# Patient Record
Sex: Male | Born: 1965 | Race: Black or African American | Hispanic: No | Marital: Single | Smoking: Current every day smoker
Health system: Southern US, Community
[De-identification: ages and names within clinical notes are randomized; demographics above are authoritative.]

---

## 2015-08-21 ENCOUNTER — Encounter: Payer: Self-pay | Admitting: *Deleted

## 2015-08-21 ENCOUNTER — Emergency Department
Admission: EM | Admit: 2015-08-21 | Discharge: 2015-08-21 | Disposition: A | Discharge status: Home / Self Care | Payer: Self-pay | Attending: Emergency Medicine | Admitting: Emergency Medicine

## 2015-08-21 DIAGNOSIS — K047 Periapical abscess without sinus: Secondary | ICD-10-CM | POA: Insufficient documentation

## 2015-08-21 DIAGNOSIS — K032 Erosion of teeth: Secondary | ICD-10-CM | POA: Insufficient documentation

## 2015-08-21 DIAGNOSIS — K029 Dental caries, unspecified: Secondary | ICD-10-CM | POA: Insufficient documentation

## 2015-08-21 MED ORDER — MAGIC MOUTHWASH W/LIDOCAINE: 5.0000 mL | Freq: Four times a day (QID) | ORAL | Status: DC

## 2015-08-21 MED ORDER — AMOXICILLIN 875 MG PO TABS: 875.0000 mg | ORAL_TABLET | Freq: Two times a day (BID) | ORAL | Status: DC

## 2015-08-21 NOTE — Discharge Instructions (Signed)
Dental Abscess °A dental abscess is a collection of pus in or around a tooth. °CAUSES °This condition is caused by a bacterial infection around the root of the tooth that involves the inner part of the tooth (pulp). It may result from: °· Severe tooth decay. °· Trauma to the tooth that allows bacteria to enter into the pulp, such as a broken or chipped tooth. °· Severe gum disease around a tooth. °SYMPTOMS °Symptoms of this condition include: °· Severe pain in and around the infected tooth. °· Swelling and redness around the infected tooth, in the mouth, or in the face. °· Tenderness. °· Pus drainage. °· Bad breath. °· Bitter taste in the mouth. °· Difficulty swallowing. °· Difficulty opening the mouth. °· Nausea. °· Vomiting. °· Chills. °· Swollen neck glands. °· Fever. °DIAGNOSIS °This condition is diagnosed with examination of the infected tooth. During the exam, your dentist may tap on the infected tooth. Your dentist will also ask about your medical and dental history and may order X-rays. °TREATMENT °This condition is treated by eliminating the infection. This may be done with: °· Antibiotic medicine. °· A root canal. This may be performed to save the tooth. °· Pulling (extracting) the tooth. This may also involve draining the abscess. This is done if the tooth cannot be saved. °HOME CARE INSTRUCTIONS °· Take medicines only as directed by your dentist. °· If you were prescribed antibiotic medicine, finish all of it even if you start to feel better. °· Rinse your mouth (gargle) often with salt water to relieve pain or swelling. °· Do not drive or operate heavy machinery while taking pain medicine. °· Do not apply heat to the outside of your mouth. °· Keep all follow-up visits as directed by your dentist. This is important. °SEEK MEDICAL CARE IF: °· Your pain is worse and is not helped by medicine. °SEEK IMMEDIATE MEDICAL CARE IF: °· You have a fever or chills. °· Your symptoms suddenly get worse. °· You have a  very bad headache. °· You have problems breathing or swallowing. °· You have trouble opening your mouth. °· You have swelling in your neck or around your eye. °  °This information is not intended to replace advice given to you by your health care provider. Make sure you discuss any questions you have with your health care provider. °  °Document Released: 03/30/2005 Document Revised: 08/14/2014 Document Reviewed: 03/27/2014 °Elsevier Interactive Patient Education ©2016 [employer] ° °Dental Care and Dentist Visits °Dental care supports good overall health. Regular dental visits can also help you avoid dental pain, bleeding, infection, and other more serious health problems in the future. It is important to keep the mouth healthy because diseases in the teeth, gums, and other oral tissues can spread to other areas of the body. Some problems, such as diabetes, heart disease, and pre-term labor have been associated with poor oral health.  °See your dentist every 6 months. If you experience emergency problems such as a toothache or broken tooth, go to the dentist right away. If you see your dentist regularly, you may catch problems early. It is easier to be treated for problems in the early stages.  °WHAT TO EXPECT AT A DENTIST VISIT  °Your dentist will look for many common oral health problems and recommend proper treatment. At your regular dental visit, you can expect: °· Gentle cleaning of the teeth and gums. This includes scraping and polishing. This helps to remove the sticky substance around the teeth and gums (  plaque). Plaque forms in the mouth shortly after eating. Over time, plaque hardens on the teeth as tartar. If tartar is not removed regularly, it can cause problems. Cleaning also helps remove stains. °· Periodic X-rays. These pictures of the teeth and supporting bone will help your dentist assess the health of your teeth. °· Periodic fluoride treatments. Fluoride is a natural mineral shown to help  strengthen teeth. Fluoride treatment involves applying a fluoride gel or varnish to the teeth. It is most commonly done in children. °· Examination of the mouth, tongue, jaws, teeth, and gums to look for any oral health problems, such as: °¨ Cavities (dental caries). This is decay on the tooth caused by plaque, sugar, and acid in the mouth. It is best to catch a cavity when it is small. °¨ Inflammation of the gums caused by plaque buildup (gingivitis). °¨ Problems with the mouth or malformed or misaligned teeth. °¨ Oral cancer or other diseases of the soft tissues or jaws.  °KEEP YOUR TEETH AND GUMS HEALTHY °For healthy teeth and gums, follow these general guidelines as well as your dentist's specific advice: °· Have your teeth professionally cleaned at the dentist every 6 months. °· Brush twice daily with a fluoride toothpaste. °· Floss your teeth daily.  °· Ask your dentist if you need fluoride supplements, treatments, or fluoride toothpaste. °· Eat a healthy diet. Reduce foods and drinks with added sugar. °· Avoid smoking. °TREATMENT FOR ORAL HEALTH PROBLEMS °If you have oral health problems, treatment varies depending on the conditions present in your teeth and gums. °· Your caregiver will most likely recommend good oral hygiene at each visit. °· For cavities, gingivitis, or other oral health disease, your caregiver will perform a procedure to treat the problem. This is typically done at a separate appointment. Sometimes your caregiver will refer you to another dental specialist for specific tooth problems or for surgery. °SEEK IMMEDIATE DENTAL CARE IF: °· You have pain, bleeding, or soreness in the gum, tooth, jaw, or mouth area. °· A permanent tooth becomes loose or separated from the gum socket. °· You experience a blow or injury to the mouth or jaw area. °  °This information is not intended to replace advice given to you by your health care provider. Make sure you discuss any questions you have with your  health care provider. °  °Document Released: 12/10/2010 Document Revised: 06/22/2011 Document Reviewed: 12/10/2010 °Elsevier Interactive Patient Education ©2016 Elsevier Inc. ° °

## 2015-08-21 NOTE — ED Notes (Signed)
States left sided dental pain and swelling for 4 days

## 2015-08-21 NOTE — ED Provider Notes (Signed)
[facility] Emergency Department Provider Note  ____________________________________________  Time seen: Approximately 3:13 PM  I have reviewed the triage vital signs and the nursing notes.   HISTORY  Chief Complaint Dental Pain    HPI Ian Jacobs is a 50 y.o. male complaining of left sided dental and facial pain x 4 days. Patient describes an extensive dental history of dental caries beginning 10 yrs ago. He attributes this most recent episode to a pervious GI illness approximately 1 week ago. He says he had a sandwich from [place] and had severe nausea, vomiting and diarrhea for the next 3 days. He noticed the dental pain he has today immediately after his vomiting subsided. He admits to limited ROM due to pain, anorexia, and swelling of the left jaw and neck. He denies HA, changes in vision, changes in hearing or fever.    History reviewed. No pertinent past medical history.  There are no active problems to display for this patient.   No past surgical history on file.  Current Outpatient Rx  Name  Route  Sig  Dispense  Refill  . amoxicillin (AMOXIL) 875 MG tablet   Oral   Take 1 tablet (875 mg total) by mouth 2 (two) times daily.   14 tablet   0   . magic mouthwash w/lidocaine SOLN   Oral   Take 5 mLs by mouth 4 (four) times daily.   240 mL   0     Dispense in a 1/1/1/1 ratio. Use lidocaine, diphen ...     Allergies Review of patient's allergies indicates no known allergies.  History reviewed. No pertinent family history.  Social History Social History  Substance Use Topics  . Smoking status: None  . Smokeless tobacco: None  . Alcohol Use: None     Review of Systems  Constitutional: No fever/chills Eyes: No visual changes. No discharge ENT: No upper respiratory complaints. Cardiovascular: no chest pain. Respiratory: no cough. No SOB. Gastrointestinal: No abdominal pain. nausea, vomiting and diarrhea 1 week ago. No  constipation. Musculoskeletal: Negative for musculoskeletal pain. Skin: Negative for rash, abrasions, lacerations, ecchymosis. Neurological: Negative for headaches, focal weakness or numbness. 10-point ROS otherwise negative.  ____________________________________________   PHYSICAL EXAM:  VITAL SIGNS: ED Triage Vitals  Enc Vitals Group     BP 08/21/15 1442 132/77 mmHg     Pulse Rate 08/21/15 1442 82     Resp 08/21/15 1442 18     Temp 08/21/15 1442 98.9 F (37.2 C)     Temp Source 08/21/15 1442 Oral     SpO2 08/21/15 1442 99 %     Weight 08/21/15 1442 179 lb (81.194 kg)     Height 08/21/15 1442  (1.803 m)     Head Cir --      Peak Flow --      Pain Score 08/21/15 1443 10     Pain Loc --      Pain Edu? --      Excl. in GC? --      Constitutional: Alert and oriented. Well appearing and in no acute distress. Eyes: Conjunctivae are normal. PERRL. EOMI. Head: Atraumatic. ENT:      Mouth/Throat: Mucous membranes are moist. Multiple dental caries evident bilaterally. Missing dentition throughout. Swelling present in left lower gumline. Halotosis evident Neck: No stridor. Hematological/Lymphatic/Immunilogical:No cervical lymphadenopathy, left-sided sublingual lymphadenopathy present.  Respiratory: Normal respiratory effort without tachypnea or retractions. Musculoskeletal: Full range of motion to all extremities. No gross deformities appreciated.  Neurologic:  Normal speech and language. No gross focal neurologic deficits are appreciated.  Skin:  Skin is warm, dry and intact. No rash noted. Psychiatric: Mood and affect are normal. Speech and behavior are normal. Patient exhibits appropriate insight and judgement.   ____________________________________________   LABS (all labs ordered are listed, but only abnormal results are displayed)  Labs Reviewed - No data to  display ____________________________________________  EKG   ____________________________________________  RADIOLOGY   No results found.  ____________________________________________    PROCEDURES  Procedure(s) performed:       Medications - No data to display   ____________________________________________   INITIAL IMPRESSION / ASSESSMENT AND PLAN / ED COURSE  Pertinent labs & imaging results that were available during my care of the patient were reviewed by me and considered in my medical decision making (see chart for details).  Patient's diagnosis is consistent with dental infection, dental caries, and dental erosions. Patient will be discharged home with prescriptions for antibiotics and magic mouthwash. Patient is to follow up with dentist. Patient is given ED precautions to return to the ED for any worsening or new symptoms.     ____________________________________________  FINAL CLINICAL IMPRESSION(S) / ED DIAGNOSES  Final diagnoses:  Dental infection  Dental caries  Dental erosion extending into pulp      NEW MEDICATIONS STARTED DURING THIS VISIT:  Discharge Medication List as of 08/21/2015  4:02 PM    START taking these medications   Details  amoxicillin (AMOXIL) 875 MG tablet Take 1 tablet (875 mg total) by mouth 2 (two) times daily., Starting 08/21/2015, Until Discontinued, Print    magic mouthwash w/lidocaine SOLN Take 5 mLs by mouth 4 (four) times daily., Starting 08/21/2015, Until Discontinued, Print            This chart was dictated using voice recognition software/Dragon. Despite best efforts to proofread, errors can occur which can change the meaning. Any change was purely unintentional.    Racheal PatchesJonathan D Jerelle Virden, PA-C 08/21/15 1637  Myrna Blazeravid Matthew Schaevitz, MD 08/22/15 [phone]

## 2017-10-29 ENCOUNTER — Ambulatory Visit
Admission: RE | Admit: 2017-10-29 | Discharge: 2017-10-29 | Disposition: A | Discharge status: Home / Self Care | Payer: Disability Insurance | Source: Ambulatory Visit | Attending: Obstetrics and Gynecology | Admitting: Obstetrics and Gynecology

## 2017-10-29 ENCOUNTER — Other Ambulatory Visit: Payer: Self-pay | Admitting: Obstetrics and Gynecology

## 2017-10-29 DIAGNOSIS — M545 Low back pain: Secondary | ICD-10-CM | POA: Diagnosis present

## 2017-10-29 DIAGNOSIS — M539 Dorsopathy, unspecified: Secondary | ICD-10-CM

## 2017-10-29 DIAGNOSIS — M4606 Spinal enthesopathy, lumbar region: Secondary | ICD-10-CM | POA: Insufficient documentation

## 2018-02-19 ENCOUNTER — Emergency Department
Admission: EM | Admit: 2018-02-19 | Discharge: 2018-02-19 | Disposition: A | Discharge status: Home / Self Care | Payer: Self-pay | Attending: Emergency Medicine | Admitting: Emergency Medicine

## 2018-02-19 ENCOUNTER — Other Ambulatory Visit: Payer: Self-pay

## 2018-02-19 ENCOUNTER — Encounter: Payer: Self-pay | Admitting: Emergency Medicine

## 2018-02-19 DIAGNOSIS — R200 Anesthesia of skin: Secondary | ICD-10-CM | POA: Insufficient documentation

## 2018-02-19 DIAGNOSIS — F17200 Nicotine dependence, unspecified, uncomplicated: Secondary | ICD-10-CM | POA: Insufficient documentation

## 2018-02-19 DIAGNOSIS — T754XXA Electrocution, initial encounter: Secondary | ICD-10-CM | POA: Insufficient documentation

## 2018-02-19 MED ORDER — GABAPENTIN 300 MG PO CAPS: 300.0000 mg | ORAL_CAPSULE | Freq: Three times a day (TID) | ORAL | 1 refills | Status: DC

## 2018-02-19 MED ORDER — MELOXICAM 15 MG PO TABS: 15.0000 mg | ORAL_TABLET | Freq: Every day | ORAL | 1 refills | Status: DC
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
## 2018-02-19 NOTE — Discharge Instructions (Addendum)
Follow-up with orthopedics for evaluation of nerve damage in the foot.  Take the medications as prescribed.  Return to the emergency department if worsening.

## 2019-06-26 ENCOUNTER — Emergency Department
Admission: EM | Admit: 2019-06-26 | Discharge: 2019-06-26 | Disposition: A | Discharge status: Home / Self Care | Payer: Medicaid Other | Attending: Emergency Medicine | Admitting: Emergency Medicine

## 2019-06-26 ENCOUNTER — Other Ambulatory Visit: Payer: Self-pay

## 2019-06-26 ENCOUNTER — Encounter: Payer: Self-pay | Admitting: Emergency Medicine

## 2019-06-26 DIAGNOSIS — Z5321 Procedure and treatment not carried out due to patient leaving prior to being seen by health care provider: Secondary | ICD-10-CM | POA: Insufficient documentation

## 2019-06-26 DIAGNOSIS — N50819 Testicular pain, unspecified: Secondary | ICD-10-CM | POA: Insufficient documentation

## 2019-06-26 DIAGNOSIS — R52 Pain, unspecified: Secondary | ICD-10-CM

## 2019-06-26 DIAGNOSIS — R609 Edema, unspecified: Secondary | ICD-10-CM

## 2019-06-26 NOTE — ED Triage Notes (Signed)
FIRST NURSE NOTE- pt was working out and fell.  C/o groin pain and swelling as well as to testicle/penis.  Ambulatory.

## 2019-06-26 NOTE — ED Triage Notes (Signed)
Pt arrival via POV from home. Pt states he was working out and using an exercise ball and that he hit hurt his pelvis/scrotum and that they've been swollen since then. Pt states 'my balls are swollen and my penis is swollen on one side'.   Pt A&Ox4 and denies fevers, shob, cough.

## 2019-06-26 NOTE — ED Notes (Signed)
Pt noted leaving in a car by [employer]

## 2019-06-27 ENCOUNTER — Telehealth: Payer: Self-pay | Admitting: Emergency Medicine

## 2019-06-27 NOTE — Telephone Encounter (Signed)
Called patient due to lwot to inquire about condition and follow up plans. Left message.   

## 2019-07-06 DIAGNOSIS — M545 Low back pain, unspecified: Secondary | ICD-10-CM | POA: Insufficient documentation

## 2019-07-06 DIAGNOSIS — M25461 Effusion, right knee: Secondary | ICD-10-CM | POA: Insufficient documentation

## 2020-02-11 IMAGING — CR DG LUMBAR SPINE 2-3V
1 series · 3 of 3 positions shown · non-contrast
Comparison: None.

CLINICAL DATA: Pain lower back into left hip. Repetitive injury
from competitive weight lifting 20 yrs ago.

EXAM:
LUMBAR SPINE - 2-3 VIEW

[Series 1: t lumbar spine ap · 0.14mm/px · 3 of 3 slices shown]
[im 1/3]
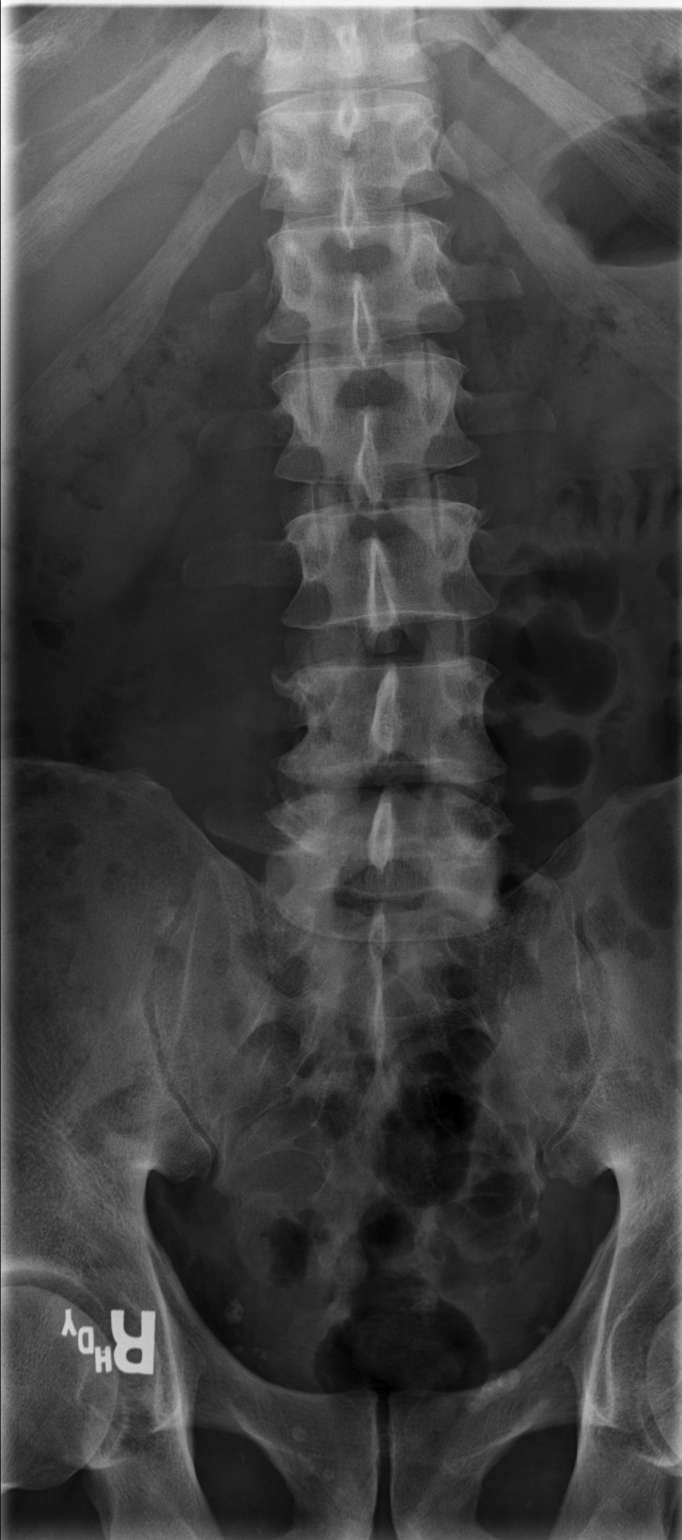
[im 2/3]
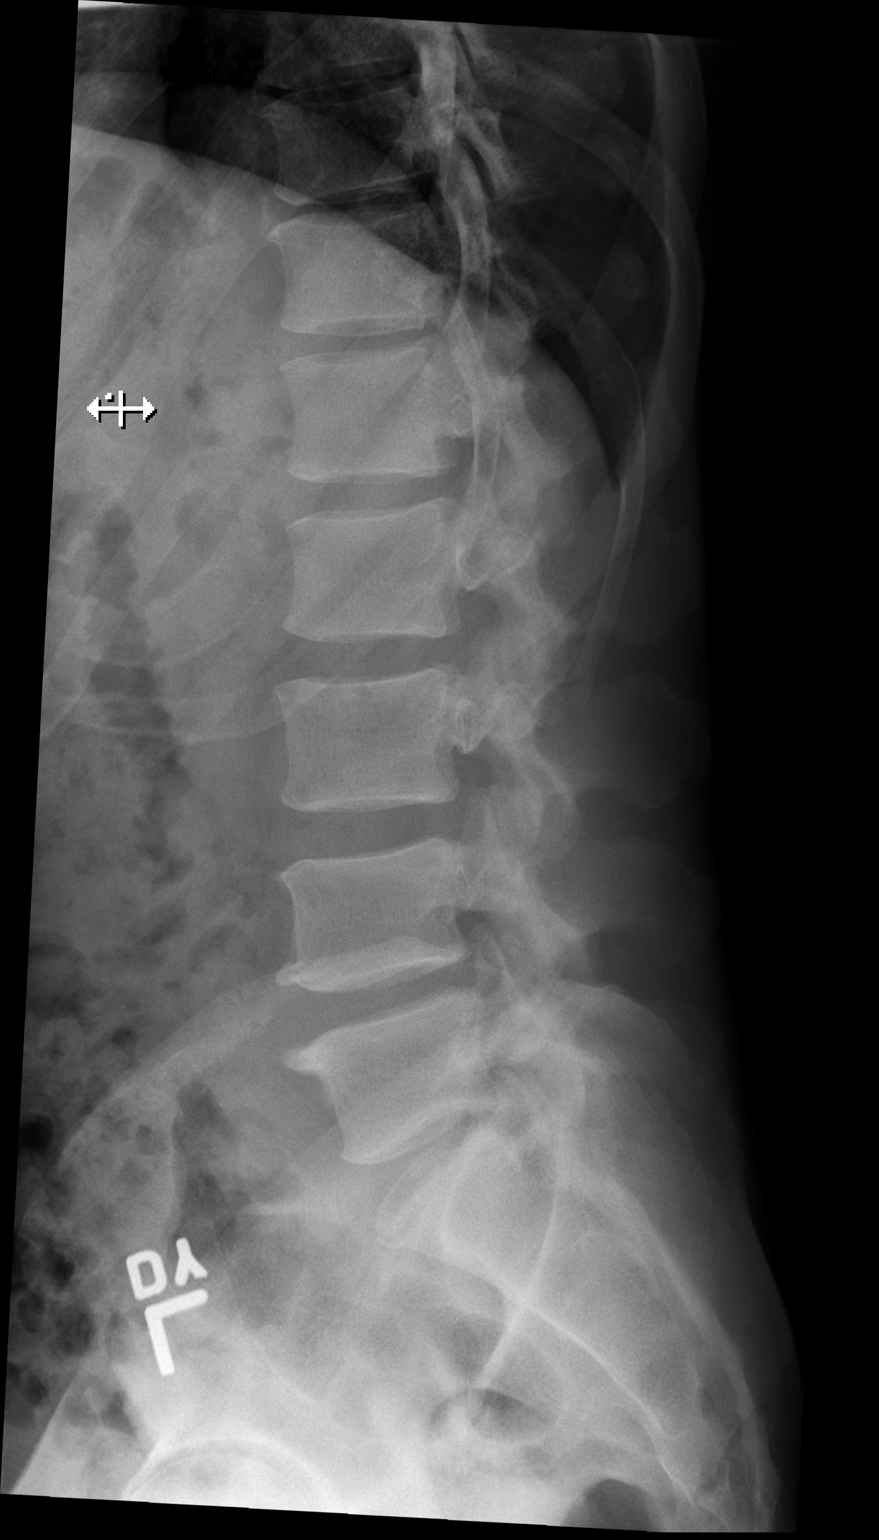
[im 3/3]
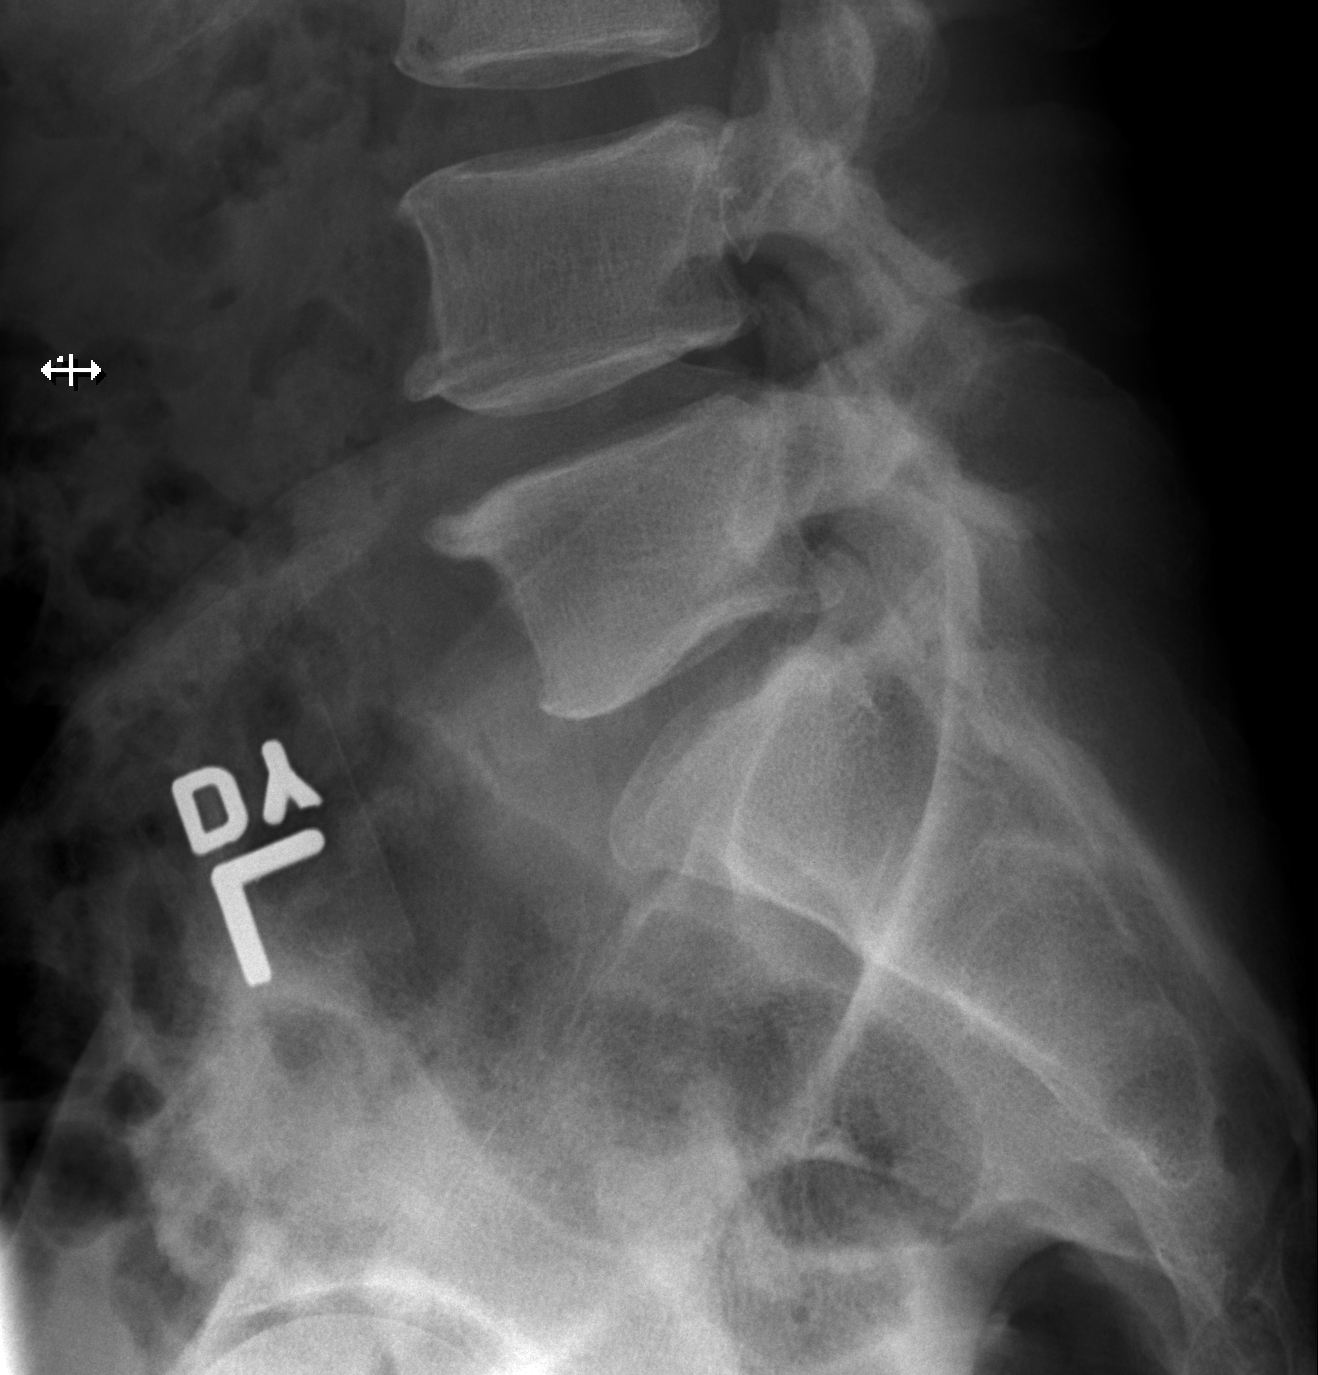

[3 of 3 positions shown; findings below may reference images not displayed]

FINDINGS: There is 5 degrees of levoconvex scoliosis between L1 and L5.

Intervertebral spurring at L4-5. The intervertebral disc spaces are
maintained. No subluxation or lumbar spine fracture observed.
IMPRESSION: 1. Intervertebral spurring at L4-5. No fracture or subluxation
identified.

## 2020-02-21 ENCOUNTER — Ambulatory Visit: Payer: Medicaid Other

## 2020-07-01 DIAGNOSIS — F329 Major depressive disorder, single episode, unspecified: Secondary | ICD-10-CM | POA: Insufficient documentation

## 2020-09-26 DIAGNOSIS — M79604 Pain in right leg: Secondary | ICD-10-CM | POA: Insufficient documentation

## 2020-09-26 DIAGNOSIS — G8911 Acute pain due to trauma: Secondary | ICD-10-CM | POA: Insufficient documentation

## 2020-09-26 DIAGNOSIS — Z7689 Persons encountering health services in other specified circumstances: Secondary | ICD-10-CM | POA: Insufficient documentation

## 2020-09-26 DIAGNOSIS — Z789 Other specified health status: Secondary | ICD-10-CM | POA: Insufficient documentation

## 2022-05-29 DIAGNOSIS — F5101 Primary insomnia: Secondary | ICD-10-CM | POA: Insufficient documentation

## 2022-05-29 DIAGNOSIS — F17210 Nicotine dependence, cigarettes, uncomplicated: Secondary | ICD-10-CM | POA: Insufficient documentation

## 2022-05-29 DIAGNOSIS — Z716 Tobacco abuse counseling: Secondary | ICD-10-CM | POA: Insufficient documentation

## 2022-08-24 ENCOUNTER — Ambulatory Visit: Payer: Medicaid Other | Admitting: Podiatry

## 2022-09-09 ENCOUNTER — Encounter: Payer: Self-pay | Admitting: Podiatry

## 2022-09-09 ENCOUNTER — Ambulatory Visit: Payer: Medicaid Other | Admitting: Podiatry

## 2022-09-09 DIAGNOSIS — B351 Tinea unguium: Secondary | ICD-10-CM | POA: Diagnosis not present

## 2022-09-09 MED ORDER — TERBINAFINE HCL 250 MG PO TABS: 250.0000 mg | ORAL_TABLET | Freq: Every day | ORAL | 0 refills | Status: AC

## 2022-09-10 LAB — HEPATIC FUNCTION PANEL
ALT: 12 IU/L (ref 0–44)
AST: 21 IU/L (ref 0–40)
Albumin: 4.8 g/dL (ref 3.8–4.9)
Alkaline Phosphatase: 81 IU/L (ref 44–121)
Bilirubin Total: 0.5 mg/dL (ref 0.0–1.2)
Bilirubin, Direct: 0.12 mg/dL (ref 0.00–0.40)
Total Protein: 7.6 g/dL (ref 6.0–8.5)

## 2022-09-11 NOTE — Progress Notes (Signed)
  Subjective:  Patient ID: Ian Holm., male    DOB: 06/06/1965,  MRN: [id number]  Chief Complaint  Patient presents with   Nail Problem    np bilateral toenails black, possible fungus    57 y.o. male presents with the above complaint. History confirmed with patient.   Objective:  Physical Exam: warm, good capillary refill, no trophic changes or ulcerative lesions, normal DP and PT pulses, normal sensory exam, and onychomycosis.       Assessment:   1. Onychomycosis      Plan:  Patient was evaluated and treated and all questions answered.  Onychomycosis -Educated on etiology of nail fungus. -Nail sample taken for microbiology and histology. -Baseline liver function studies ordered. Will d/c terbinafine if elevated during therapy. -eRx for oral terbinafine #30. Educated on risks and benefits of the medication. -LFTs checked and are normal   No follow-ups on file.

## 2022-11-27 ENCOUNTER — Emergency Department
Admission: EM | Admit: 2022-11-27 | Discharge: 2022-11-27 | Disposition: A | Discharge status: Home / Self Care | Payer: MEDICAID | Attending: Emergency Medicine | Admitting: Emergency Medicine

## 2022-11-27 ENCOUNTER — Other Ambulatory Visit: Payer: Self-pay

## 2022-11-27 ENCOUNTER — Emergency Department: Payer: MEDICAID

## 2022-11-27 DIAGNOSIS — R310 Gross hematuria: Secondary | ICD-10-CM

## 2022-11-27 DIAGNOSIS — N3001 Acute cystitis with hematuria: Secondary | ICD-10-CM | POA: Diagnosis not present

## 2022-11-27 DIAGNOSIS — R319 Hematuria, unspecified: Secondary | ICD-10-CM | POA: Diagnosis present

## 2022-11-27 LAB — URINALYSIS, ROUTINE W REFLEX MICROSCOPIC
Bacteria, UA: NONE SEEN
Bilirubin Urine: NEGATIVE
Glucose, UA: NEGATIVE mg/dL
Ketones, ur: NEGATIVE mg/dL
Nitrite: NEGATIVE
Protein, ur: 30 mg/dL — AB
RBC / HPF: >50 RBC/hpf (ref 0–5)
Specific Gravity, Urine: 1.027 (ref 1.005–1.030)
Squamous Epithelial / HPF: NONE SEEN /HPF (ref 0–5)
WBC, UA: >50 WBC/hpf (ref 0–5)
pH: 5 (ref 5.0–8.0)

## 2022-11-27 LAB — BASIC METABOLIC PANEL
Anion gap: 9 (ref 5–15)
BUN: 12 mg/dL (ref 6–20)
CO2: 24 mmol/L (ref 22–32)
Calcium: 9.2 mg/dL (ref 8.9–10.3)
Chloride: 106 mmol/L (ref 98–111)
Creatinine, Ser: 0.94 mg/dL (ref 0.61–1.24)
GFR, Estimated: >60 mL/min (ref >60)
Glucose, Bld: 96 mg/dL (ref 70–99)
Potassium: 3.5 mmol/L (ref 3.5–5.1)
Sodium: 139 mmol/L (ref 135–145)

## 2022-11-27 LAB — CBC
HCT: 40.7 % (ref 39.0–52.0)
Hemoglobin: 13.4 g/dL (ref 13.0–17.0)
MCH: 28.2 pg (ref 26.0–34.0)
MCHC: 32.9 g/dL (ref 30.0–36.0)
MCV: 85.5 fL (ref 80.0–100.0)
Platelets: 206 10*3/uL (ref 150–400)
RBC: 4.76 MIL/uL (ref 4.22–5.81)
RDW: 15.5 % (ref 11.5–15.5)
WBC: 5.5 10*3/uL (ref 4.0–10.5)
nRBC: 0 % (ref 0.0–0.2)

## 2022-11-27 MED ORDER — CEPHALEXIN 500 MG PO CAPS: 500.0000 mg | ORAL_CAPSULE | Freq: Three times a day (TID) | ORAL | 0 refills | Status: AC

## 2022-11-27 NOTE — ED Provider Notes (Signed)
[facility] Provider Note    Event Date/Time   First MD Initiated Contact with Patient 11/27/22 2059     (approximate)   History   Hematuria   HPI  Ian Jacobs is a 57 y.o. male here with bloody urine.  The patient states that earlier today, he urinated and noticed that it was grossly bloody.  He has never had anything like this before.  He felt fine prior to this.  No recent trauma or falls.  He does exercise a lot but does not exercise any more than usual.  No recent high risk sexual activity.  No other acute complaints.     Physical Exam   Triage Vital Signs: ED Triage Vitals  Encounter Vitals Group     BP 11/27/22 1637 (!) 191/167     Systolic BP Percentile --      Diastolic BP Percentile --      Pulse Rate 11/27/22 1637 81     Resp 11/27/22 1637 18     Temp 11/27/22 1637 98.3 F (36.8 C)     Temp src --      SpO2 11/27/22 1637 100 %     Weight --      Height --      Head Circumference --      Peak Flow --      Pain Score 11/27/22 1635 0     Pain Loc --      Pain Education --      Exclude from Growth Chart --     Most recent vital signs: Vitals:   11/27/22 2130 11/27/22 2200  BP: 128/87 125/87  Pulse: (!) 56 (!) 54  Resp:  18  Temp:    SpO2: 98% 99%     General: Awake, no distress.  CV:  Good peripheral perfusion.  Resp:  Normal work of breathing.  Abd:  No distention.  No flank pain.  No tenderness.  No rebound or guarding. Other:  No lower extremity edema.   ED Results / Procedures / Treatments   Labs (all labs ordered are listed, but only abnormal results are displayed) Labs Reviewed  URINALYSIS, ROUTINE W REFLEX MICROSCOPIC - Abnormal; Notable for the following components:      Result Value   Color, Urine YELLOW (*)    APPearance CLOUDY (*)    Hgb urine dipstick LARGE (*)    Protein, ur 30 (*)    Leukocytes,Ua LARGE (*)    All other components within normal limits  CBC  BASIC METABOLIC PANEL      EKG    RADIOLOGY CT stone: Enlarged prostate, otherwise unremarkable   I also independently reviewed and agree with radiologist interpretations.   PROCEDURES:  Critical Care performed: No   MEDICATIONS ORDERED IN ED: Medications - No data to display   IMPRESSION / MDM / ASSESSMENT AND PLAN / ED COURSE  I reviewed the triage vital signs and the nursing notes.                              Differential diagnosis includes, but is not limited to, UTI, BPH, glomerulonephritis, idiopathic hematuria  Patient's presentation is most consistent with acute presentation with potential threat to life or bodily function.  The patient is on the cardiac monitor to evaluate for evidence of arrhythmia and/or significant heart rate changes   57 yo M here with hematuria, now resolved. Unclear etiology -  pt does have sx concerning for BPH and UA is c/f UTI. No known STI exposures. Will tx for UTI with ABX, refer to Urology as pt does have a smoking history. CBC without leukocytosis or anemia. Renal function is normal, and he no signs to suggest significant GN or rhabdo.    FINAL CLINICAL IMPRESSION(S) / ED DIAGNOSES   Final diagnoses:  Gross hematuria  Acute cystitis with hematuria     Rx / DC Orders   ED Discharge Orders          Ordered    cephALEXin (KEFLEX) 500 MG capsule  3 times daily        11/27/22 2213             Note:  This document was prepared using Dragon voice recognition software and may include unintentional dictation errors.   Shaune Pollack, MD 11/27/22 [phone]

## 2022-11-27 NOTE — Discharge Instructions (Signed)
Take the antibiotic as prescribed  Drink plenty of fluid  Call Urology to set up an appointment in the next 1-2 weeks

## 2022-11-27 NOTE — ED Triage Notes (Addendum)
Pt comes with c/o blood in urine. Pt states he got bit by something on his arm and then later he went to the bathroom and had blood in urine. . Pt denies any belly pain.

## 2023-01-18 ENCOUNTER — Ambulatory Visit: Payer: MEDICAID
